# Patient Record
Sex: Male | Born: 1980 | Race: White | Hispanic: No | Marital: Single | Smoking: Current every day smoker
Health system: Southern US, Community
[De-identification: ages and names within clinical notes are randomized; demographics above are authoritative.]

## PROBLEM LIST (undated history)

## (undated) HISTORY — PX: TONSILLECTOMY: SUR1361

---

## 2019-12-17 ENCOUNTER — Emergency Department (HOSPITAL_COMMUNITY): Payer: Self-pay

## 2019-12-17 ENCOUNTER — Emergency Department (HOSPITAL_COMMUNITY)
Admission: EM | Admit: 2019-12-17 | Discharge: 2019-12-17 | Disposition: A | Discharge status: Home / Self Care | Payer: Self-pay | Attending: Emergency Medicine | Admitting: Emergency Medicine

## 2019-12-17 ENCOUNTER — Encounter (HOSPITAL_COMMUNITY): Payer: Self-pay | Admitting: Emergency Medicine

## 2019-12-17 ENCOUNTER — Other Ambulatory Visit: Payer: Self-pay

## 2019-12-17 DIAGNOSIS — Z87891 Personal history of nicotine dependence: Secondary | ICD-10-CM | POA: Insufficient documentation

## 2019-12-17 DIAGNOSIS — R109 Unspecified abdominal pain: Secondary | ICD-10-CM

## 2019-12-17 DIAGNOSIS — M549 Dorsalgia, unspecified: Secondary | ICD-10-CM | POA: Insufficient documentation

## 2019-12-17 LAB — CBC WITH DIFFERENTIAL/PLATELET
Abs Immature Granulocytes: 0.01 10*3/uL (ref 0.00–0.07)
Basophils Absolute: 0.1 10*3/uL (ref 0.0–0.1)
Basophils Relative: 2 %
Eosinophils Absolute: 0 10*3/uL (ref 0.0–0.5)
Eosinophils Relative: 0 %
HCT: 47.1 % (ref 39.0–52.0)
Hemoglobin: 16.1 g/dL (ref 13.0–17.0)
Immature Granulocytes: 0 %
Lymphocytes Relative: 27 %
Lymphs Abs: 1.1 10*3/uL (ref 0.7–4.0)
MCH: 31.1 pg (ref 26.0–34.0)
MCHC: 34.2 g/dL (ref 30.0–36.0)
MCV: 90.9 fL (ref 80.0–100.0)
Monocytes Absolute: 0.5 10*3/uL (ref 0.1–1.0)
Monocytes Relative: 13 %
Neutro Abs: 2.3 10*3/uL (ref 1.7–7.7)
Neutrophils Relative %: 58 %
Platelets: 202 10*3/uL (ref 150–400)
RBC: 5.18 MIL/uL (ref 4.22–5.81)
RDW: 13.3 % (ref 11.5–15.5)
WBC: 3.9 10*3/uL — ABNORMAL LOW (ref 4.0–10.5)
nRBC: 0 % (ref 0.0–0.2)

## 2019-12-17 LAB — COMPREHENSIVE METABOLIC PANEL
ALT: 38 U/L (ref 0–44)
AST: 30 U/L (ref 15–41)
Albumin: 4.2 g/dL (ref 3.5–5.0)
Alkaline Phosphatase: 75 U/L (ref 38–126)
Anion gap: 11 (ref 5–15)
BUN: 8 mg/dL (ref 6–20)
CO2: 24 mmol/L (ref 22–32)
Calcium: 9.2 mg/dL (ref 8.9–10.3)
Chloride: 104 mmol/L (ref 98–111)
Creatinine, Ser: 0.87 mg/dL (ref 0.61–1.24)
GFR calc Af Amer: >60 mL/min (ref >60)
GFR calc non Af Amer: >60 mL/min (ref >60)
Glucose, Bld: 111 mg/dL — ABNORMAL HIGH (ref 70–99)
Potassium: 3.9 mmol/L (ref 3.5–5.1)
Sodium: 139 mmol/L (ref 135–145)
Total Bilirubin: 0.4 mg/dL (ref 0.3–1.2)
Total Protein: 7.5 g/dL (ref 6.5–8.1)

## 2019-12-17 LAB — LIPASE, BLOOD: Lipase: 17 U/L (ref 11–51)

## 2019-12-17 LAB — TROPONIN I (HIGH SENSITIVITY): Troponin I (High Sensitivity): <2 ng/L (ref <18)

## 2019-12-17 MED ORDER — ALUM & MAG HYDROXIDE-SIMETH 200-200-20 MG/5ML PO SUSP
30.0000 mL | Freq: Once | ORAL | Status: DC
  Filled 2019-12-17: qty 30

## 2019-12-17 MED ORDER — OMEPRAZOLE 20 MG PO CPDR: 20.0000 mg | DELAYED_RELEASE_CAPSULE | Freq: Two times a day (BID) | ORAL | 0 refills | Status: AC

## 2019-12-17 MED ORDER — LIDOCAINE VISCOUS HCL 2 % MT SOLN
15.0000 mL | Freq: Once | OROMUCOSAL | Status: DC
  Filled 2019-12-17: qty 15

## 2019-12-17 NOTE — Discharge Instructions (Signed)
Begin taking Prilosec as prescribed.  Take ibuprofen 600 mg every 6 hours as needed for pain.  Follow-up with primary doctor if not improving in the next week, and return to the ER if symptoms significantly worsen or change.

## 2019-12-17 NOTE — ED Triage Notes (Signed)
Pt c/o center back pain x 3 days, headaches, dizziness, constipation, and a hot flash when waking up this morning, pt reports he has not taken any OTC meds and has not seen a PCP

## 2019-12-17 NOTE — ED Notes (Signed)
Pt to CT

## 2019-12-17 NOTE — ED Provider Notes (Signed)
MSE was initiated and I personally evaluated the patient and placed orders (if any) at  6:30 AM on December 17, 2019.  The patient appears stable so that the remainder of the MSE may be completed by another provider.  Patient states he is having lower tailbone pain for the last 2 to 3 days.  Actually look at his back its more the sacral area just at the junction at the lumbar spine.  He denies any fall.  He also states he had diarrhea 2 days ago and since then has not had a bowel movement.  He denies having hard bowel movements or having balls but he feels like he is constipated.  He denies any abdominal distention or pain.  He also states he has a headache for the last few days and puts his hands on the top of his head.  He states he thought it was his sinuses.  He states that it is throbbing, he has not had any injury.  There is no nausea or vomiting.  He has also had this for the past few days.  He also states he had "indigestion" this morning which was a central chest pain that was burning.  He denies having acid reflux.  He states normally he throws up and it feels better.  He has been having this off and on for a year.  He also wanted know about why he is having hot flashes and feeling dizzy.  X-ray was ordered of his abdomen 2 view, EKG and troponin was done.   Devoria Albe, MD 12/17/19 [phone]

## 2019-12-17 NOTE — ED Notes (Signed)
Pt doesn't want the GI cocktail. He would like a prescription for prilosec

## 2019-12-17 NOTE — Clinical Social Work Note (Signed)
Transition of Care [facility]) - Emergency Department Mini Assessment  Patient Details  Name: Ignace Mandigo MRN: [id number] Date of Birth: [birth year]  [facility] of [facility]) CM/SW Contact:    Ewing Schlein, LCSW Phone Number: 12/17/2019, 9:30 AM  Clinical Narrative: Patient is a 39 year old male who presented to the ED for back pain. Per chart review, patient does not have insurance or a PCP. CSW spoke with patient regarding referral to [facility] to assist with PCP needs. Patient agreeable to referral. CSW called [facility] to make referral. TOC signing off.  ED Mini Assessment: What brought you to the Emergency Department? : Back pain Barriers to Discharge: ED Barriers Resolved Barrier interventions: Referral to [facility] for PCP needs Means of departure: Car Interventions which prevented an admission or readmission: Other (must enter comment) (Referral to [facility] for PCP needs)  Patient Contact and Communications Key Contact 1: [facility] Contact Date: 12/17/19,   Contact time: 0927 Contact Phone Number: [phone] Call outcome: Referral made  Admission diagnosis:  Multiple complaints There are no problems to display for this patient.  PCP:  Patient, No Pcp Per Pharmacy:  No Pharmacies Listed

## 2019-12-17 NOTE — ED Provider Notes (Signed)
[facility] EMERGENCY DEPARTMENT Provider Note   CSN: [id number] Arrival date & time: 12/17/19  6063     History Chief Complaint  Patient presents with  . Back Pain    Livingston Denner is a 39 y.o. male.  Patient is a 39 year old male with no significant past medical history except anxiety.  He presents today for evaluation of multiple complaints.  He describes pain in his back and right side as well as reflux that has been ongoing for several weeks.  He denies any bowel or bladder complaints.  He denies any fevers or chills.    He also describes generalized headache with no neck pain.  He denies any visual disturbances, numbness, or tingling.  The history is provided by the patient.       History reviewed. No pertinent past medical history.  There are no problems to display for this patient.   Past Surgical History:  Procedure Laterality Date  . TONSILLECTOMY         No family history on file.  Social History   Tobacco Use  . Smoking status: Former Smoker    Types: Cigarettes    Quit date: 10/31/2019    Years since quitting: 0.1  . Smokeless tobacco: Never Used  Vaping Use  . Vaping Use: Never used  Substance Use Topics  . Alcohol use: Not Currently  . Drug use: Not Currently    Home Medications Prior to Admission medications   Not on File    Allergies    Patient has no known allergies.  Review of Systems   Review of Systems  All other systems reviewed and are negative.   Physical Exam Updated Vital Signs BP 124/89   Pulse 89   Temp 98.2 F (36.8 C) (Oral)   Resp 18   Ht 6' (1.829 m)   Wt 78.5 kg   SpO2 99%   BMI 23.46 kg/m   Physical Exam Vitals and nursing note reviewed.  Constitutional:      General: He is not in acute distress.    Appearance: He is well-developed. He is not diaphoretic.  HENT:     Head: Normocephalic and atraumatic.  Eyes:     Extraocular Movements: Extraocular movements intact.     Pupils: Pupils are equal,  round, and reactive to light.  Cardiovascular:     Rate and Rhythm: Normal rate and regular rhythm.     Heart sounds: No murmur heard.  No friction rub.  Pulmonary:     Effort: Pulmonary effort is normal. No respiratory distress.     Breath sounds: Normal breath sounds. No wheezing or rales.  Abdominal:     General: Bowel sounds are normal. There is no distension.     Palpations: Abdomen is soft.     Tenderness: There is no abdominal tenderness.  Musculoskeletal:        General: Normal range of motion.     Cervical back: Normal range of motion and neck supple.  Skin:    General: Skin is warm and dry.  Neurological:     General: No focal deficit present.     Mental Status: He is alert and oriented to person, place, and time.     Cranial Nerves: No cranial nerve deficit.     Sensory: No sensory deficit.     Motor: No weakness.     Coordination: Coordination normal.     Deep Tendon Reflexes: Reflexes normal.     ED Results / Procedures / Treatments  Labs (all labs ordered are listed, but only abnormal results are displayed) Labs Reviewed  COMPREHENSIVE METABOLIC PANEL  CBC WITH DIFFERENTIAL/PLATELET  LIPASE, BLOOD  TROPONIN I (HIGH SENSITIVITY)    EKG EKG Interpretation  Date/Time:  Thursday December 17 2019 06:52:17 EDT Ventricular Rate:  79 PR Interval:    QRS Duration: 87 QT Interval:  353 QTC Calculation: 405 R Axis:   93 Text Interpretation: Sinus rhythm Borderline right axis deviation ECG OTHERWISE WITHIN NORMAL LIMITS No prior ecg for comparison Confirmed by Geoffery Lyons ([id number]) on 12/17/2019 7:07:19 AM   Radiology DG Abd 2 Views  Result Date: 12/17/2019 CLINICAL DATA:  Constipation.  Low back pain. EXAM: ABDOMEN - 2 VIEW COMPARISON:  No prior. FINDINGS: Air-filled loops of minimally distended small bowel noted. Colon is nondistended. No free air. No pathologic intra-abdominal calcification. Mild lumbar scoliosis concave right. No acute bony abnormality.  IMPRESSION: Air-filled loops of minimally distended small bowel noted. Colon is nondistended. Follow-up exam suggested demonstrate resolution and to exclude developing bowel obstruction. No free air. Electronically Signed   By: Maisie Fus  Register   On: 12/17/2019 07:13    Procedures Procedures (including critical care time)  Medications Ordered in ED Medications  alum & mag hydroxide-simeth (MAALOX/MYLANTA) 200-200-20 MG/5ML suspension 30 mL (has no administration in time range)    And  lidocaine (XYLOCAINE) 2 % viscous mouth solution 15 mL (has no administration in time range)    ED Course  I have reviewed the triage vital signs and the nursing notes.  Pertinent labs & imaging results that were available during my care of the patient were reviewed by me and considered in my medical decision making (see chart for details).    MDM Rules/Calculators/A&P  Patient's workup is unremarkable for any intra-abdominal process, UTI, or other abnormality.  Nothing today appears emergent.  Will discharge with prn return/follow up.  Final Clinical Impression(s) / ED Diagnoses Final diagnoses:  None    Rx / DC Orders ED Discharge Orders    None       Geoffery Lyons, MD 12/18/19 0710

## 2021-06-09 IMAGING — CT CT RENAL STONE PROTOCOL
2 of 3 series · 16 of 46 positions shown, 18 images · non-contrast
Comparison: None.

CLINICAL DATA: Low back and flank pain for 2 days.

EXAM:
CT ABDOMEN AND PELVIS WITHOUT CONTRAST
TECHNIQUE: Multidetector CT imaging of the abdomen and pelvis was performed
following the standard protocol without IV contrast.

[Series 2: axial st · axial · 0.71mm/px · z∈[+877,+1272]mm · 13 of 91 slices shown, 15 images]
[im 6/91  soft-tissue]
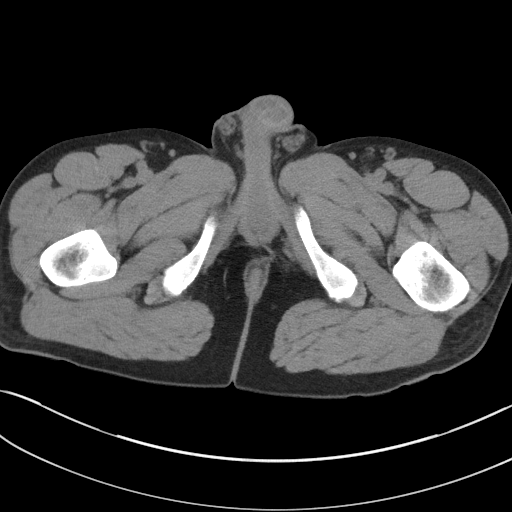
[im 6/91  bone]
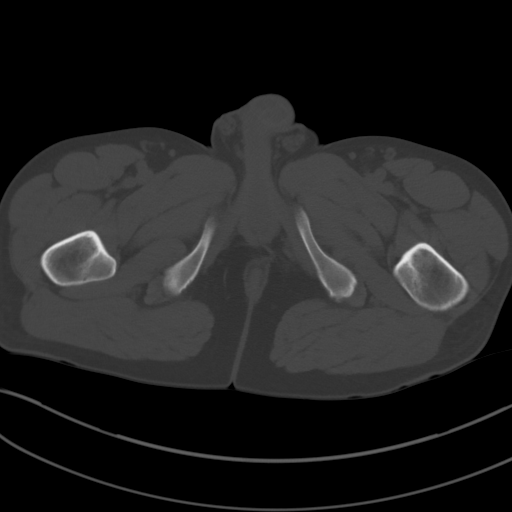
[im 12/91  soft-tissue]
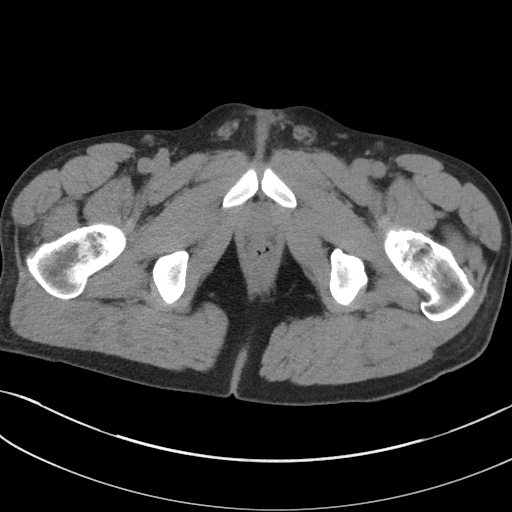
[im 18/91  soft-tissue]
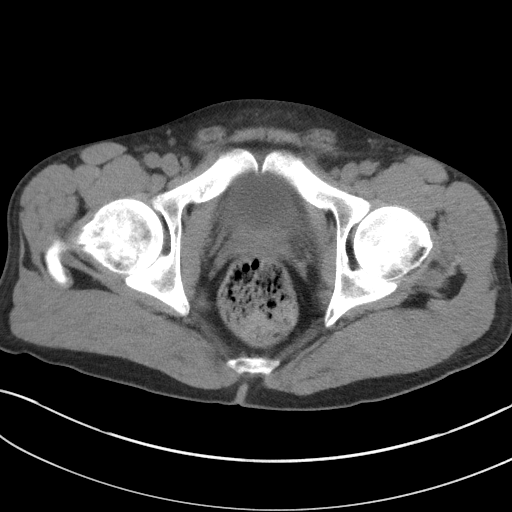
[im 27/91  soft-tissue]
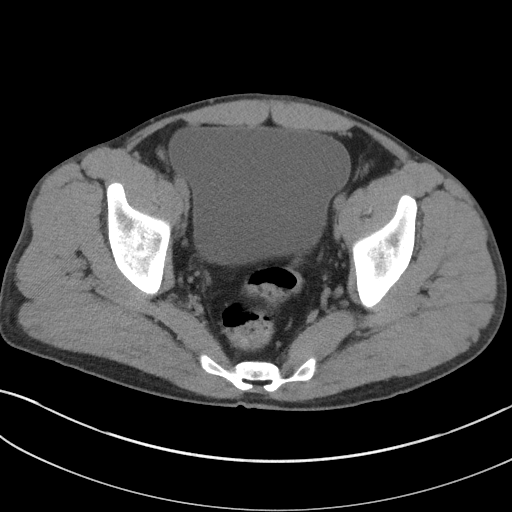
[im 32/91  soft-tissue]
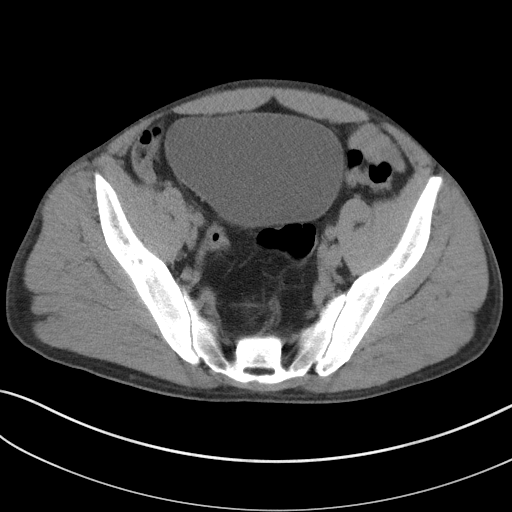
[im 38/91  soft-tissue]
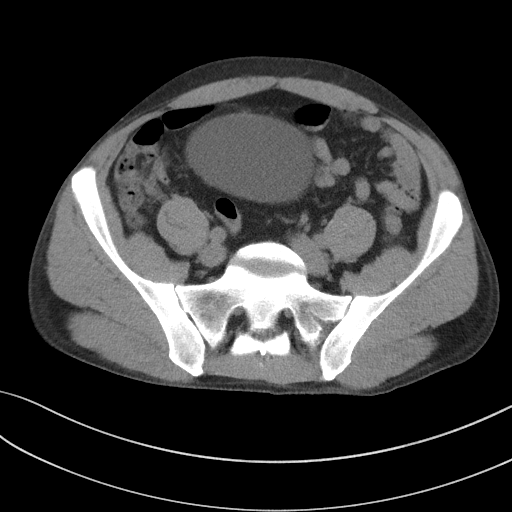
[im 47/91  soft-tissue]
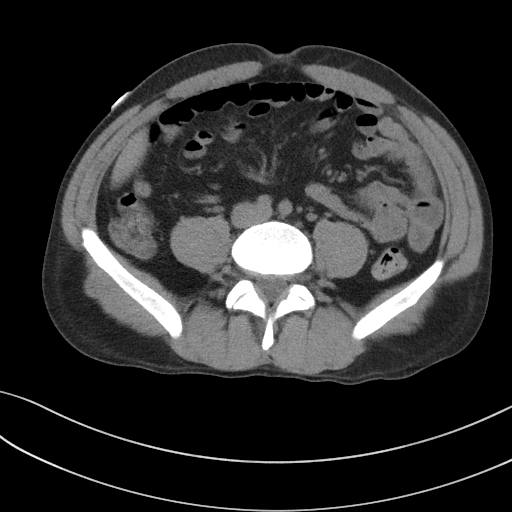
[im 53/91  soft-tissue]
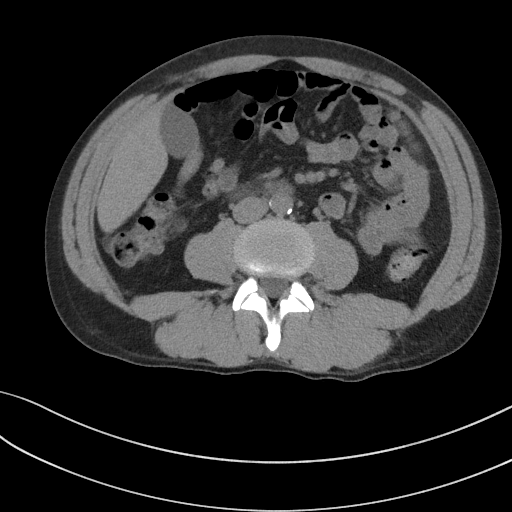
[im 59/91  soft-tissue]
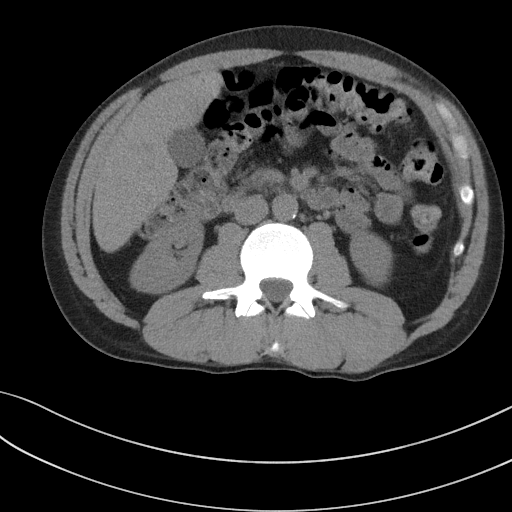
[im 59/91  bone]
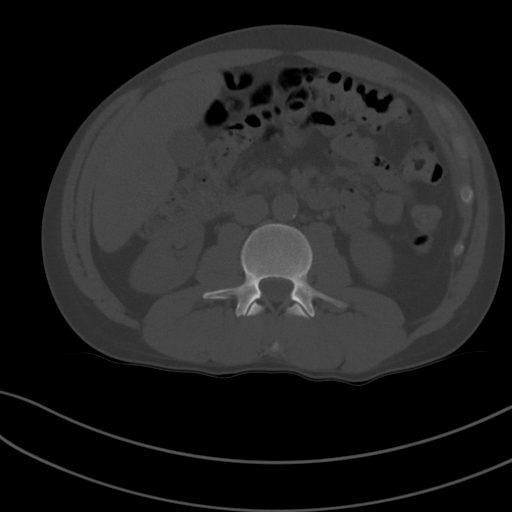
[im 64/91  soft-tissue]
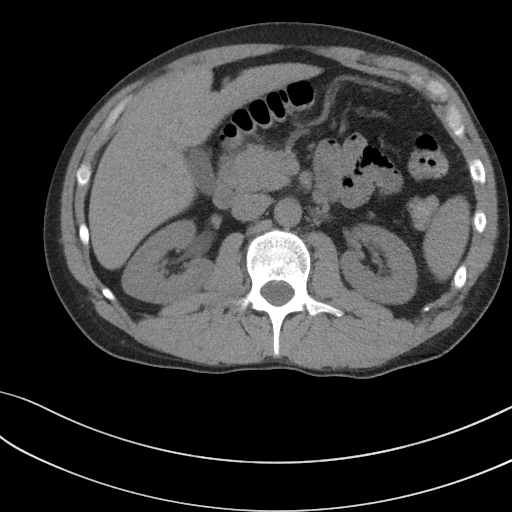
[im 73/91  soft-tissue]
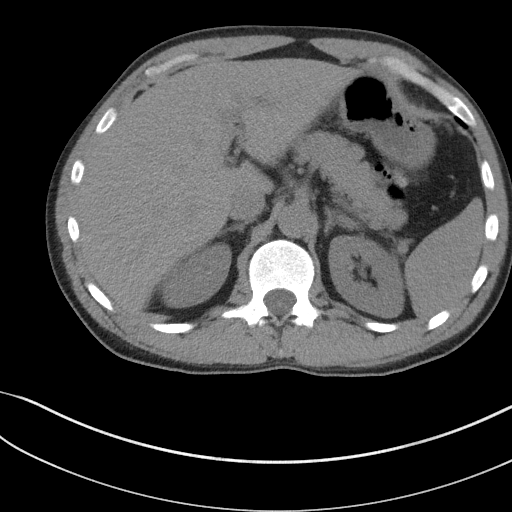
[im 79/91  soft-tissue]
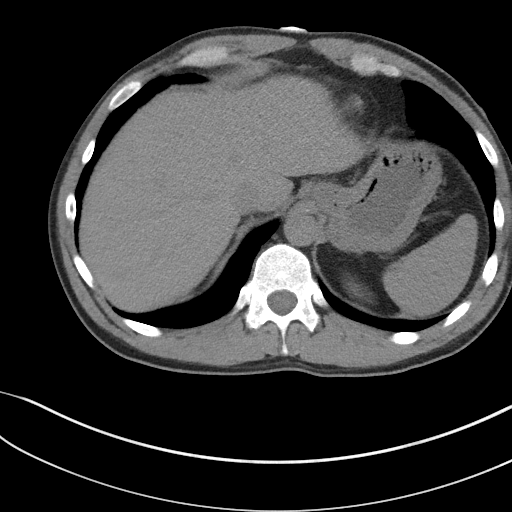
[im 85/91  soft-tissue]
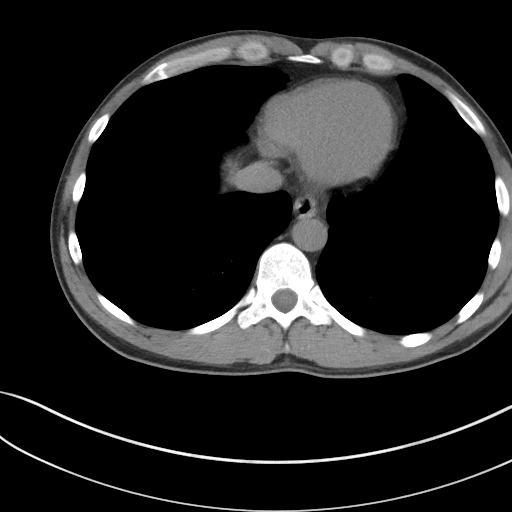

[Series 5: coronal st · coronal · 0.77mm/px · 3 of 101 slices shown]
[im 34/101  soft-tissue]
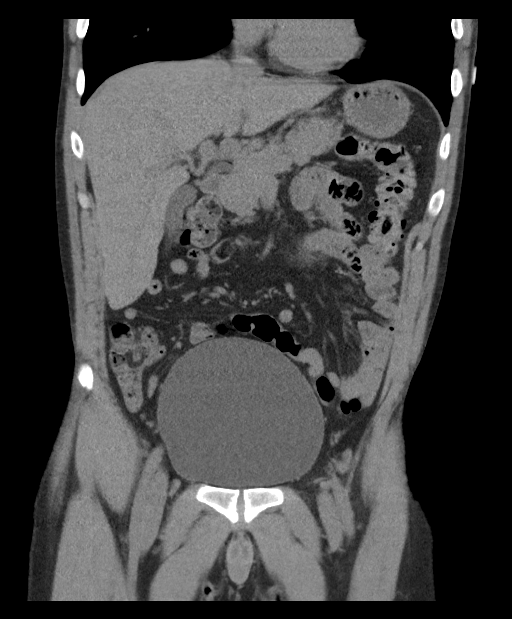
[im 45/101  soft-tissue]
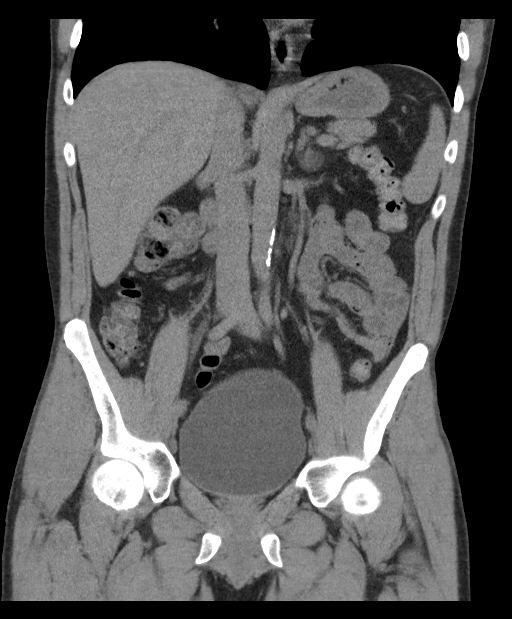
[im 56/101  soft-tissue]
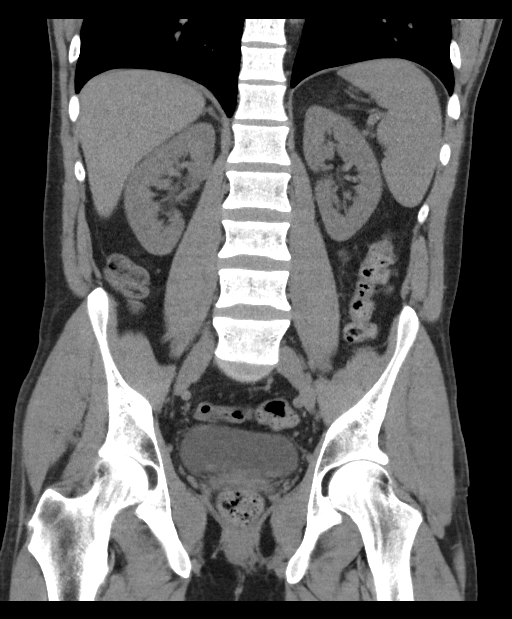

[16 of 46 positions shown; findings below may reference images not displayed]

FINDINGS: Lower chest: Insert lung bases

Hepatobiliary: No hepatic lesions are identified without contrast.
No intra or extrahepatic biliary dilatation. The gallbladder is
normal.

Pancreas: No mass, inflammation or ductal dilatation.

Spleen: Normal size. No focal lesions.

Adrenals/Urinary Tract: The adrenal glands are normal. No renal,
ureteral or bladder calculi. No worrisome renal or bladder lesions
are identified without contrast. The bladder is moderately
distended.

Stomach/Bowel: The stomach, duodenum, small bowel and colon are
grossly normal. No acute inflammatory process, mass lesion or
obstructive findings. The terminal ileum and appendix are normal.

Vascular/Lymphatic: Age advanced atherosclerotic calcifications
involving the aorta. No mesenteric or retroperitoneal mass or
adenopathy.

Reproductive: The prostate gland and seminal vesicles are
unremarkable.

Other: No pelvic mass or adenopathy. No free pelvic fluid
collections. No inguinal mass or adenopathy. No abdominal wall
hernia or subcutaneous lesions.

Musculoskeletal: No significant bony findings. No large lumbar disc
protrusions. No pars defects. Both hips are normally located.
IMPRESSION: 1. No acute abdominal/pelvic findings, mass lesion or adenopathy.
2. No renal, ureteral or bladder calculi or worrisome renal or
bladder lesions without contrast.
3. Age advanced atherosclerotic calcifications involving the aorta.

Aortic Atherosclerosis ([id number]).

## 2021-06-09 IMAGING — DX DG ABDOMEN 2V
2 series · 2 of 2 positions shown · non-contrast
Comparison: No prior.

CLINICAL DATA: Constipation.  Low back pain.

EXAM:
ABDOMEN - 2 VIEW

[abdomen erect]
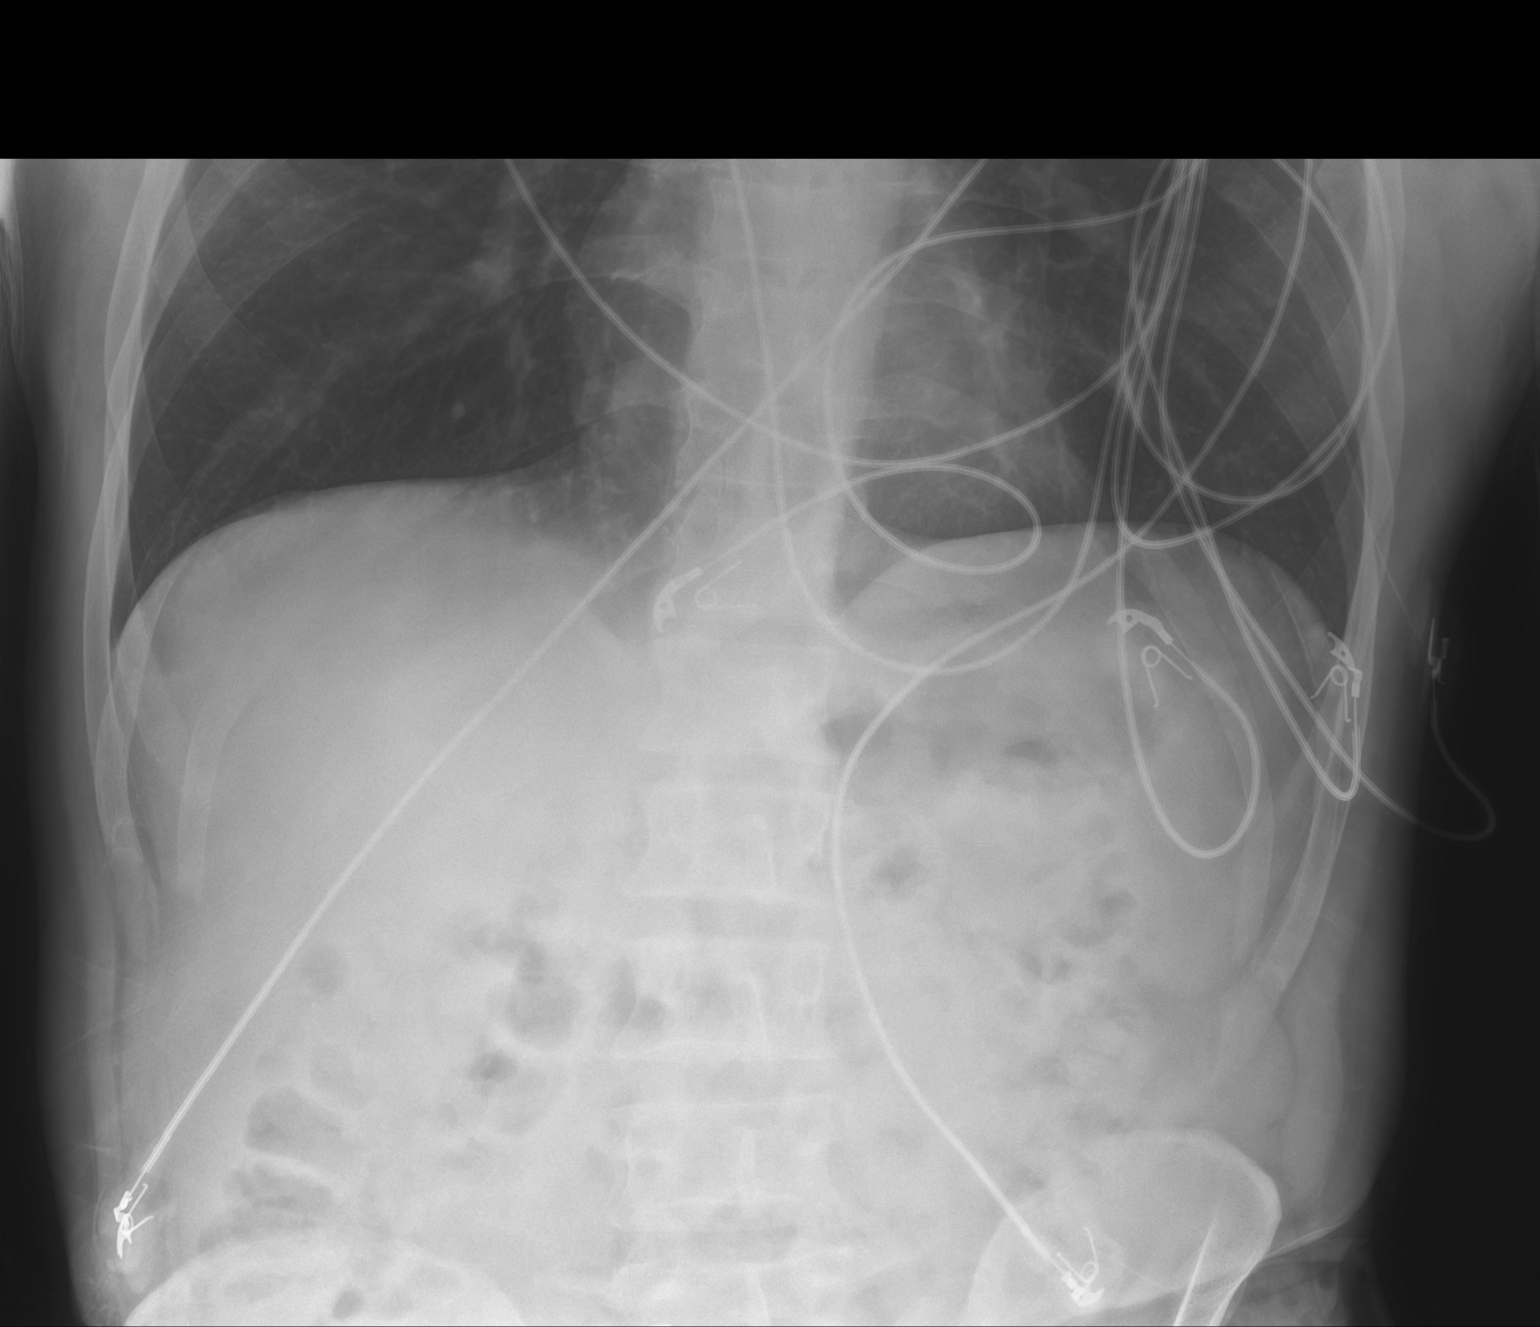

[abdomen supine]
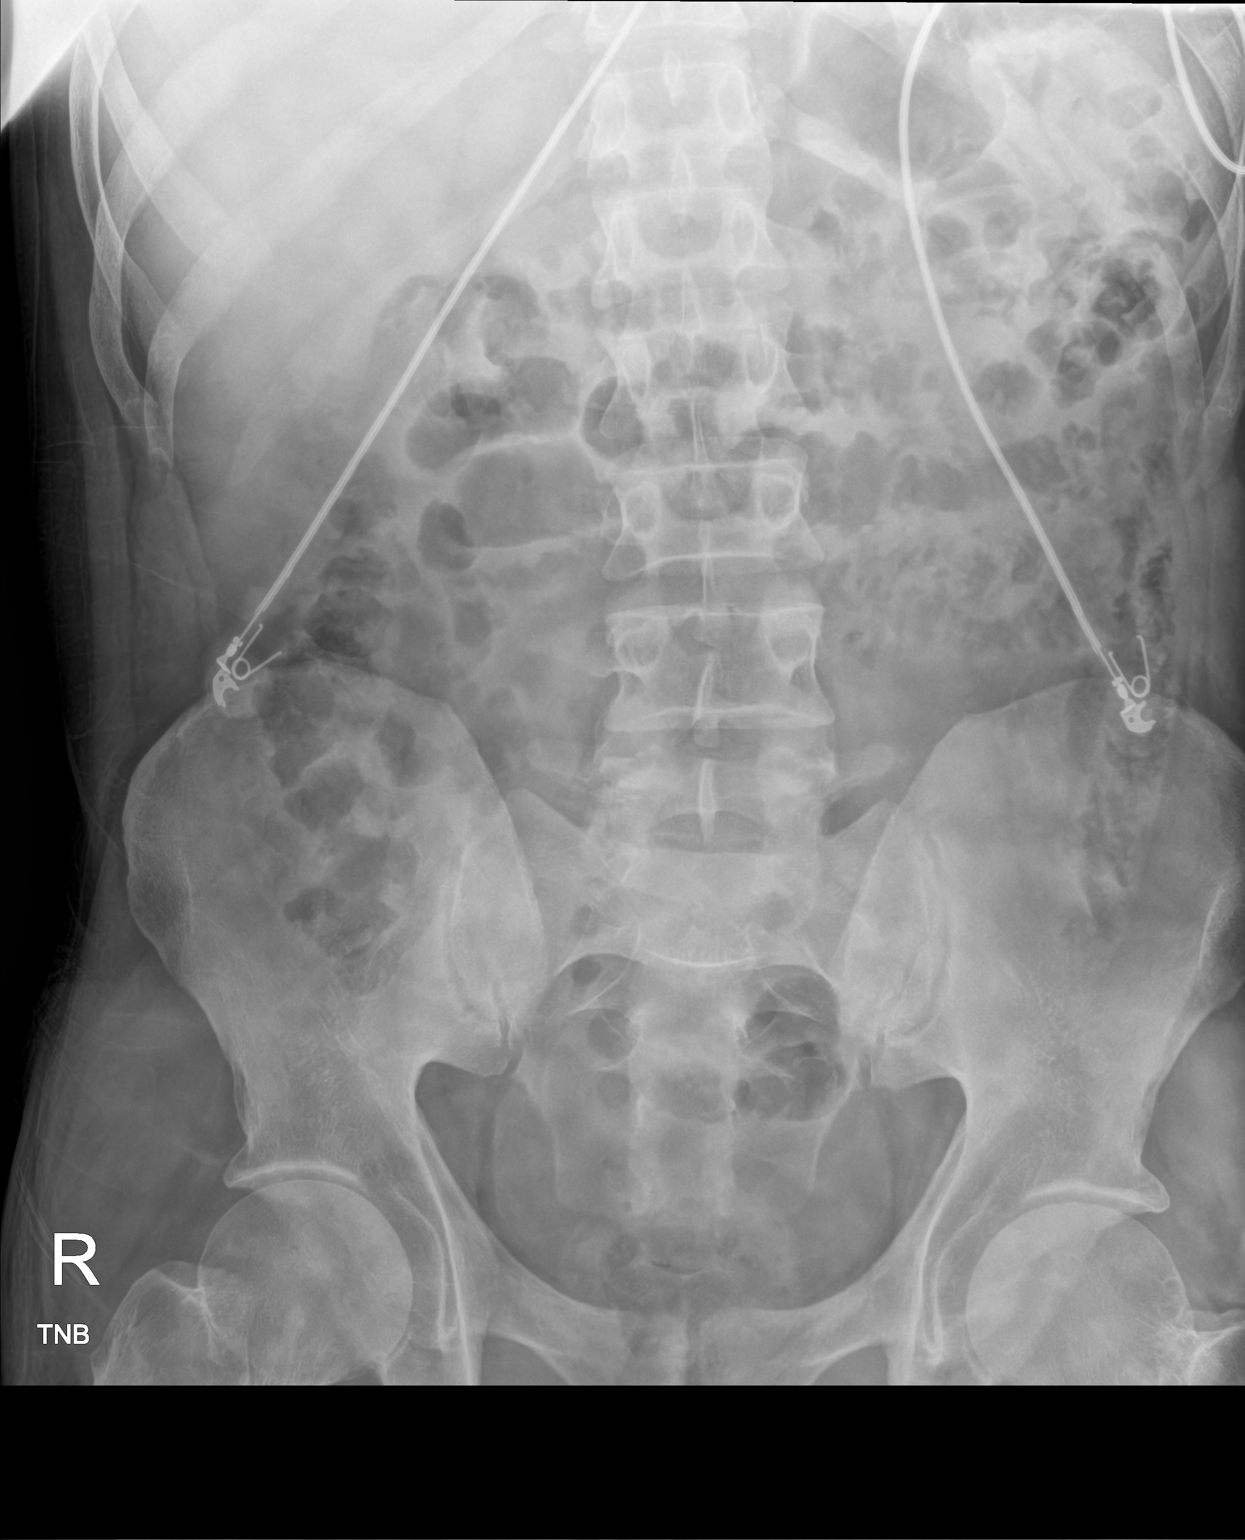

[2 of 2 positions shown; findings below may reference images not displayed]

FINDINGS: Air-filled loops of minimally distended small bowel noted. Colon is
nondistended. No free air. No pathologic intra-abdominal
calcification. Mild lumbar scoliosis concave right. No acute bony
abnormality.
IMPRESSION: Air-filled loops of minimally distended small bowel noted. Colon is
nondistended. Follow-up exam suggested demonstrate resolution and to
exclude developing bowel obstruction. No free air.

## 2022-05-30 ENCOUNTER — Encounter: Payer: Self-pay | Admitting: Emergency Medicine

## 2022-05-30 ENCOUNTER — Ambulatory Visit
Admission: EM | Admit: 2022-05-30 | Discharge: 2022-05-30 | Disposition: A | Discharge status: Home / Self Care | Payer: Self-pay

## 2022-05-30 DIAGNOSIS — R110 Nausea: Secondary | ICD-10-CM

## 2022-05-30 DIAGNOSIS — H6692 Otitis media, unspecified, left ear: Secondary | ICD-10-CM

## 2022-05-30 MED ORDER — AMOXICILLIN-POT CLAVULANATE 875-125 MG PO TABS: 1.0000 | ORAL_TABLET | Freq: Two times a day (BID) | ORAL | 0 refills | Status: AC

## 2022-05-30 MED ORDER — ONDANSETRON 4 MG PO TBDP: 4.0000 mg | ORAL_TABLET | Freq: Three times a day (TID) | ORAL | 0 refills | Status: AC | PRN

## 2022-05-30 NOTE — ED Triage Notes (Signed)
Patient states that he feels "off balance" for several weeks, along with nausea.  No history of vertigo.  No PCP.

## 2022-05-30 NOTE — ED Provider Notes (Signed)
[name]    CSN: [id number] Arrival date & time: 05/30/22  1540      History   Chief Complaint Chief Complaint  Patient presents with   Nausea    HPI Lawrence Stephens is a 42 y.o. male.   HPI 42 year old male presents off balance for several weeks along with nausea.  No history of vertigo.  No PCP.  History reviewed. No pertinent past medical history.  There are no problems to display for this patient.   Past Surgical History:  Procedure Laterality Date   TONSILLECTOMY         Home Medications    Prior to Admission medications   Medication Sig Start Date End Date Taking? Authorizing Provider  amoxicillin-clavulanate (AUGMENTIN) 875-125 MG tablet Take 1 tablet by mouth every 12 (twelve) hours. 05/30/22  Yes Eliezer Lofts, FNP  ondansetron (ZOFRAN-ODT) 4 MG disintegrating tablet Take 1 tablet (4 mg total) by mouth every 8 (eight) hours as needed for nausea or vomiting. 05/30/22  Yes Eliezer Lofts, FNP  Potassium Chloride ER 20 MEQ TBCR Take 1 tablet by mouth daily. 05/22/22  Yes [provider]  omeprazole (PRILOSEC) 20 MG capsule Take 1 capsule (20 mg total) by mouth 2 (two) times daily before a meal. 12/17/19   Veryl Speak, MD    Family History History reviewed. No pertinent family history.  Social History Social History   Tobacco Use   Smoking status: Every Day    Types: Cigarettes    Last attempt to quit: 10/31/2019    Years since quitting: 2.5   Smokeless tobacco: Never  Vaping Use   Vaping Use: Never used  Substance Use Topics   Alcohol use: Not Currently   Drug use: Not Currently     Allergies   Patient has no known allergies.   Review of Systems Review of Systems  HENT:  Positive for ear pain.   Gastrointestinal:  Positive for nausea.  All other systems reviewed and are negative.    Physical Exam Triage Vital Signs ED Triage Vitals  Enc Vitals Group     BP 05/30/22 0947 125/72     Pulse Rate 05/30/22 0947 (!) 103      Resp 05/30/22 0947 18     Temp 05/30/22 0947 98.6 F (37 C)     Temp Source 05/30/22 0947 Oral     SpO2 05/30/22 0947 97 %     Weight --      Height 05/30/22 0949 6' (1.829 m)     Head Circumference --      Peak Flow --      Pain Score 05/30/22 0949 0     Pain Loc --      Pain Edu? --      Excl. in [place]? --    No data found.  Updated Vital Signs BP 125/72 (BP Location: Left Arm)   Pulse (!) 103   Temp 98.6 F (37 C) (Oral)   Resp 18   Ht 6' (1.829 m)   SpO2 97%   BMI 23.46 kg/m   Visual Acuity Right Eye Distance:   Left Eye Distance:   Bilateral Distance:    Right Eye Near:   Left Eye Near:    Bilateral Near:     Physical Exam Vitals and nursing note reviewed.  Constitutional:      Appearance: Normal appearance. He is normal weight.  HENT:     Head: Normocephalic and atraumatic.     Right Ear: Tympanic  membrane and external ear normal.     Left Ear: External ear normal.     Ears:     Comments: Left TM: Erythematous, bulging; with moderate eustachian tube dysfunction noted bilaterally    Nose: Nose normal.     Mouth/Throat:     Mouth: Mucous membranes are moist.     Pharynx: Oropharynx is clear.  Eyes:     Extraocular Movements: Extraocular movements intact.     Conjunctiva/sclera: Conjunctivae normal.     Pupils: Pupils are equal, round, and reactive to light.  Cardiovascular:     Rate and Rhythm: Normal rate and regular rhythm.     Pulses: Normal pulses.     Heart sounds: Normal heart sounds.  Pulmonary:     Effort: Pulmonary effort is normal.     Breath sounds: Normal breath sounds. No wheezing, rhonchi or rales.  Musculoskeletal:        General: Normal range of motion.     Cervical back: Normal range of motion and neck supple.  Skin:    General: Skin is warm and dry.  Neurological:     General: No focal deficit present.     Mental Status: He is alert and oriented to person, place, and time. Mental status is at baseline.      UC Treatments  / Results  Labs (all labs ordered are listed, but only abnormal results are displayed) Labs Reviewed - No data to display  EKG   Radiology No results found.  Procedures Procedures (including critical care time)  Medications Ordered in UC Medications - No data to display  Initial Impression / Assessment and Plan / UC Course  I have reviewed the triage vital signs and the nursing notes.  Pertinent labs & imaging results that were available during my care of the patient were reviewed by me and considered in my medical decision making (see chart for details).     MDM: 1. Acute left otitis media-Rx'd Augmentin 2.  Nausea-Rx'd Zofran. Advised patient to take medication as directed with food to completion.  Advised may use Zofran daily or as needed for nausea.  Encouraged patient to increase daily water intake to 64 ounces per day while taking these medications.  Advised if symptoms worsen and/or unresolved please follow-up with PCP or here for further evaluation.  Work note provided to patient prior to discharge.  Discharged home, hemodynamically stable.  Final Clinical Impressions(s) / UC Diagnoses   Final diagnoses:  Nausea  Acute left otitis media     Discharge Instructions      Advised patient to take medications as directed with food to completion.  Advised may use Zofran daily or as needed for nausea.  Encouraged patient to increase daily water intake to 64 ounces per day while taking these medications.  Advised if symptoms worsen and/or unresolved please follow-up with PCP or here for further evaluation.     ED Prescriptions     Medication Sig Dispense Auth. Provider   amoxicillin-clavulanate (AUGMENTIN) 875-125 MG tablet Take 1 tablet by mouth every 12 (twelve) hours. 14 tablet Eliezer Lofts, FNP   ondansetron (ZOFRAN-ODT) 4 MG disintegrating tablet Take 1 tablet (4 mg total) by mouth every 8 (eight) hours as needed for nausea or vomiting. 24 tablet Eliezer Lofts,  FNP      PDMP not reviewed this encounter.   Eliezer Lofts, [name] 05/30/22 1059

## 2022-05-30 NOTE — Discharge Instructions (Addendum)
Advised patient to take medications as directed with food to completion.  Advised may use Zofran daily or as needed for nausea.  Encouraged patient to increase daily water intake to 64 ounces per day while taking these medications.  Advised if symptoms worsen and/or unresolved please follow-up with PCP or here for further evaluation.
